# Patient Record
Sex: Female | Born: 1995 | Race: White | Hispanic: No | Marital: Single | Smoking: Never smoker
Health system: Southern US, Community
[De-identification: ages and names within clinical notes are randomized; demographics above are authoritative.]

## PROBLEM LIST (undated history)

## (undated) DIAGNOSIS — K8020 Calculus of gallbladder without cholecystitis without obstruction: Secondary | ICD-10-CM

## (undated) HISTORY — PX: EYE SURGERY: SHX253

---
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
## 2017-01-29 IMAGING — US US ABDOMEN COMPLETE
1 series · 14 of 25 positions shown · non-contrast
Comparison: None.

CLINICAL DATA: Sharp epigastric pain and nausea and since early
this morning.

EXAM:
ULTRASOUND ABDOMEN COMPLETE

[Series 1: us abdomen complete · 0.22mm/px · 14 of 86 slices shown]
[im 1/86]
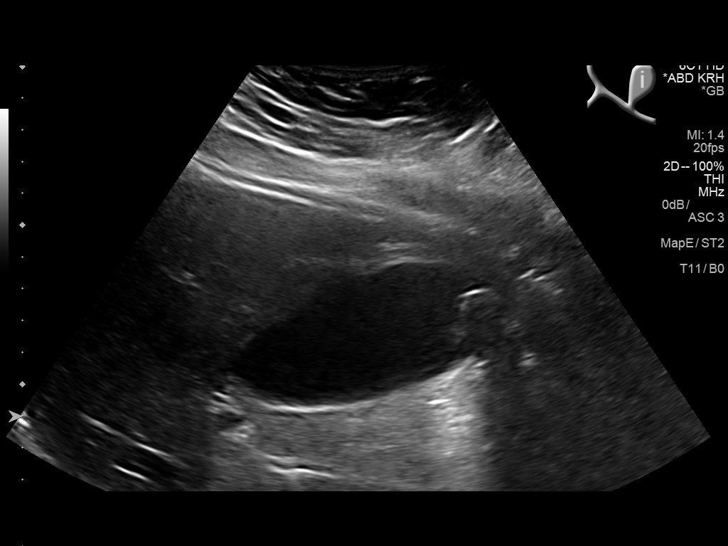
[im 8/86]
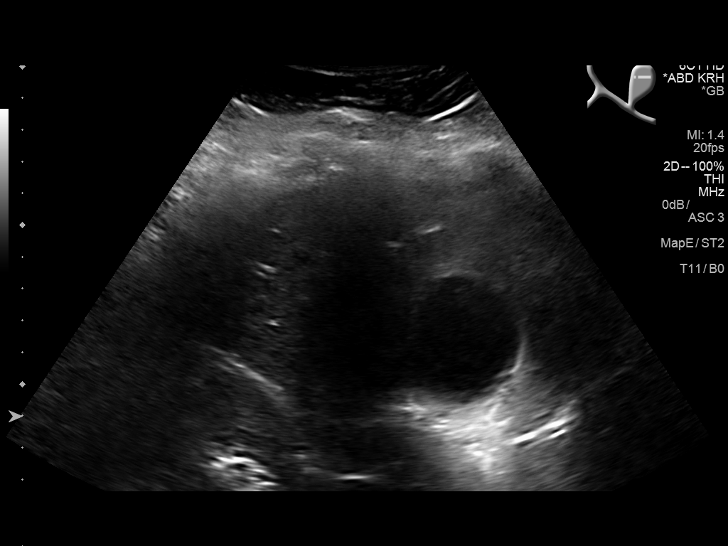
[im 15/86]
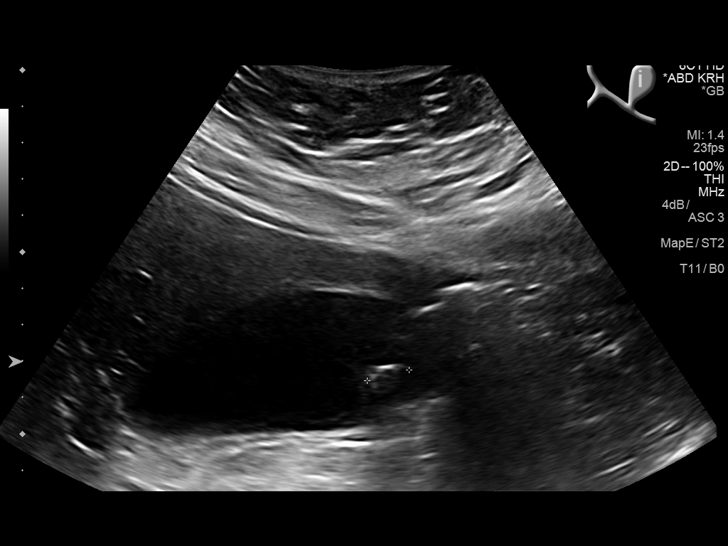
[im 22/86]
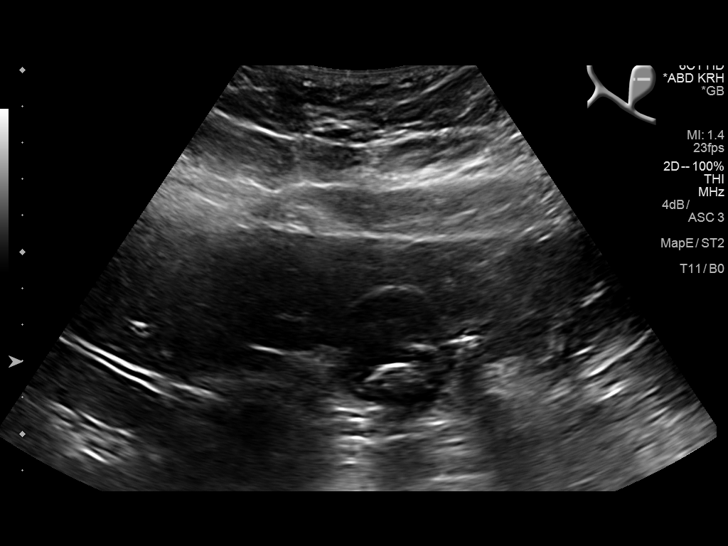
[im 29/86]
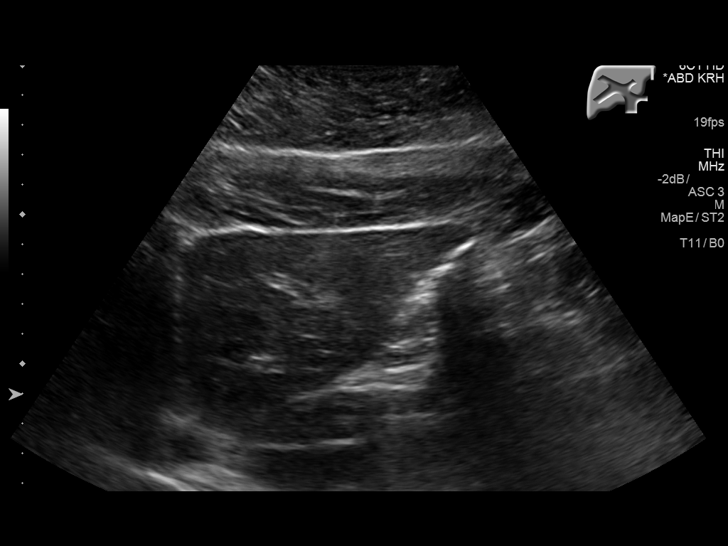
[im 32/86]
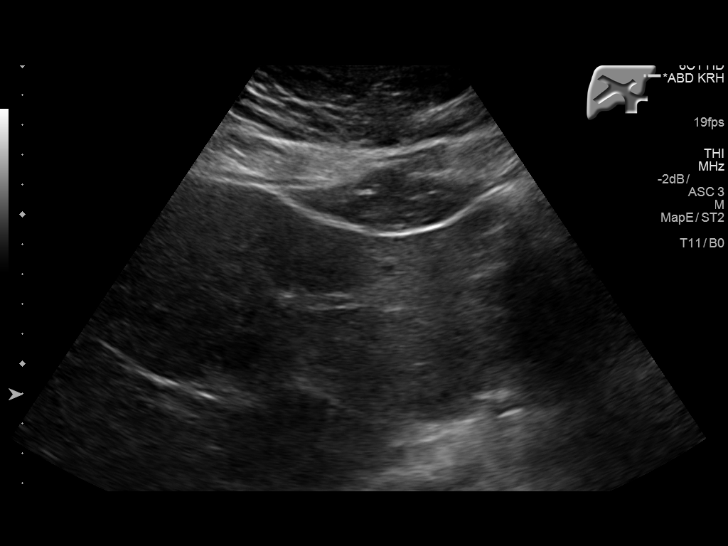
[im 39/86]
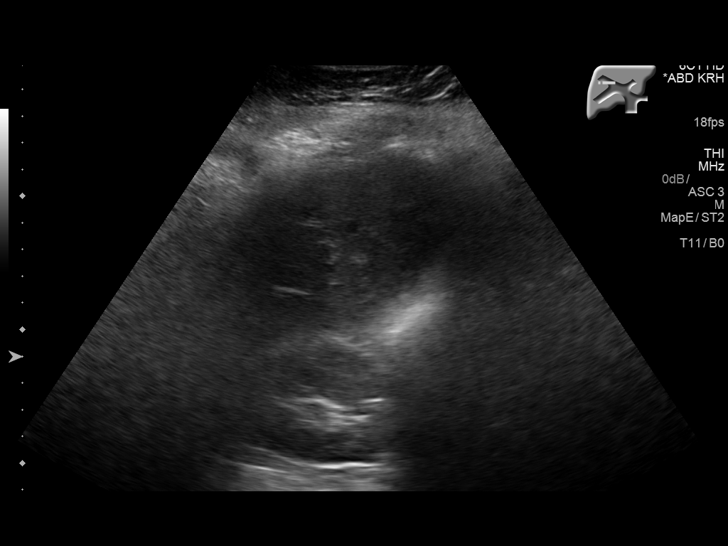
[im 47/86]
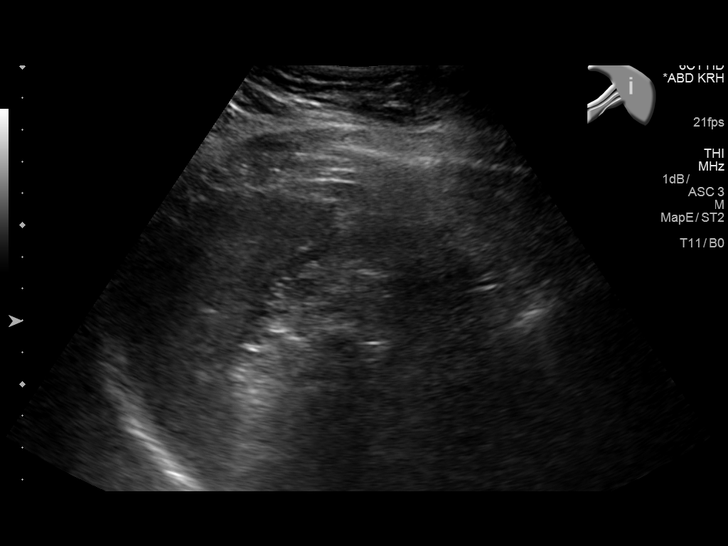
[im 54/86]
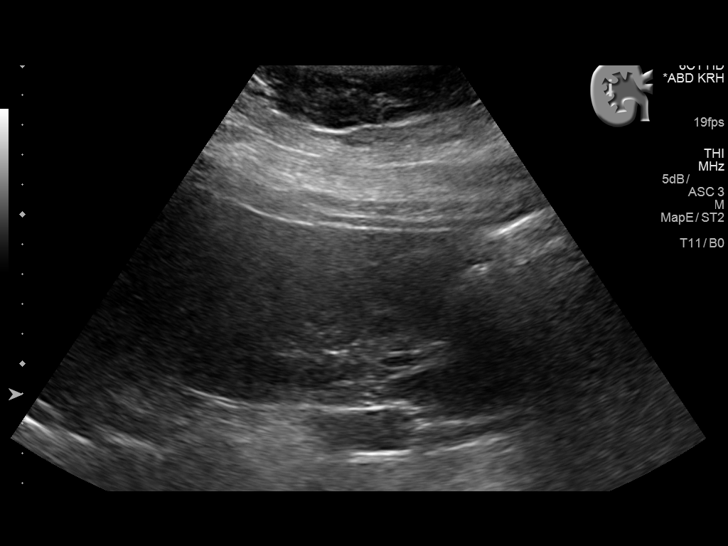
[im 57/86]
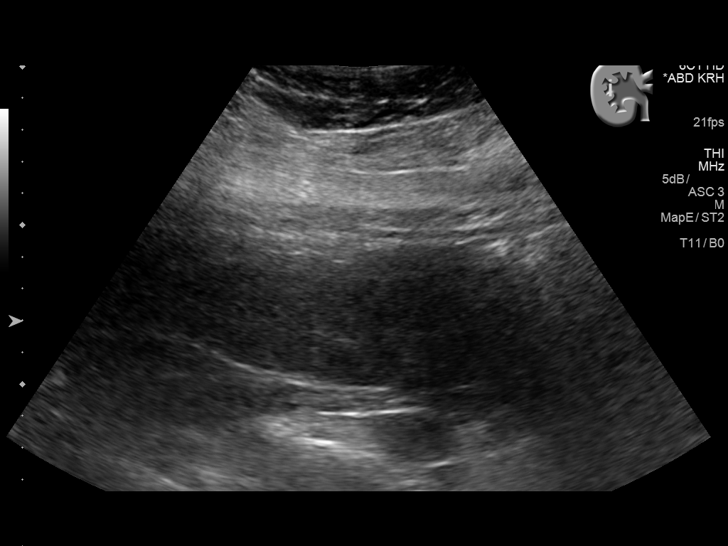
[im 64/86]
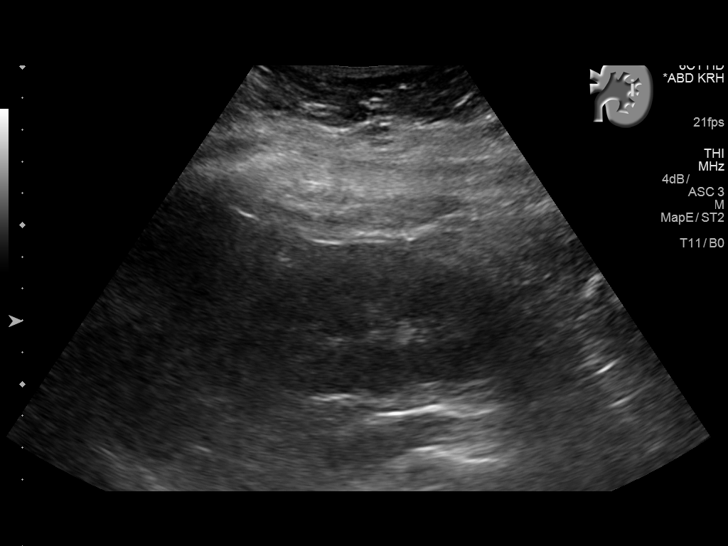
[im 71/86]
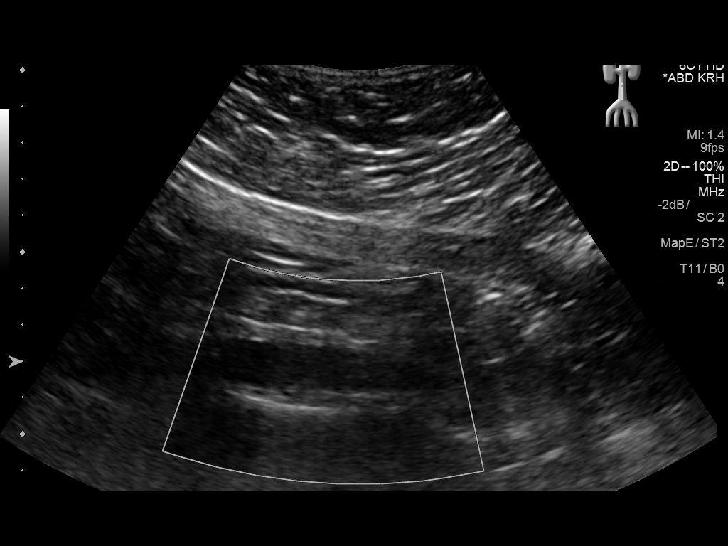
[im 78/86]
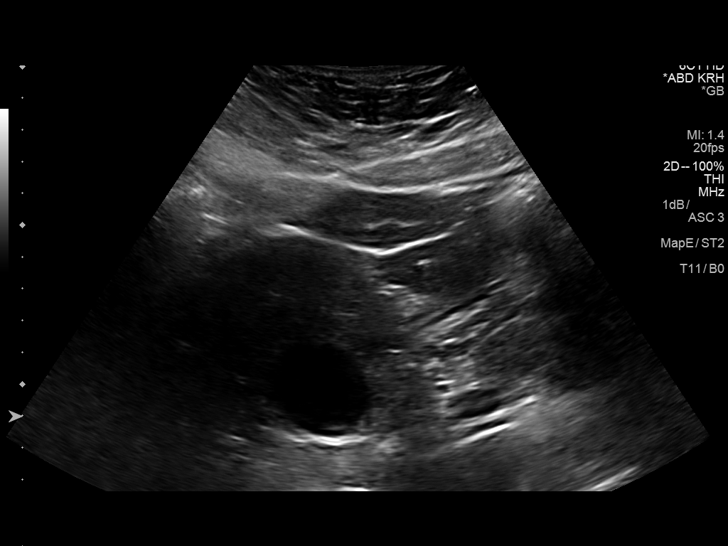
[im 86/86]
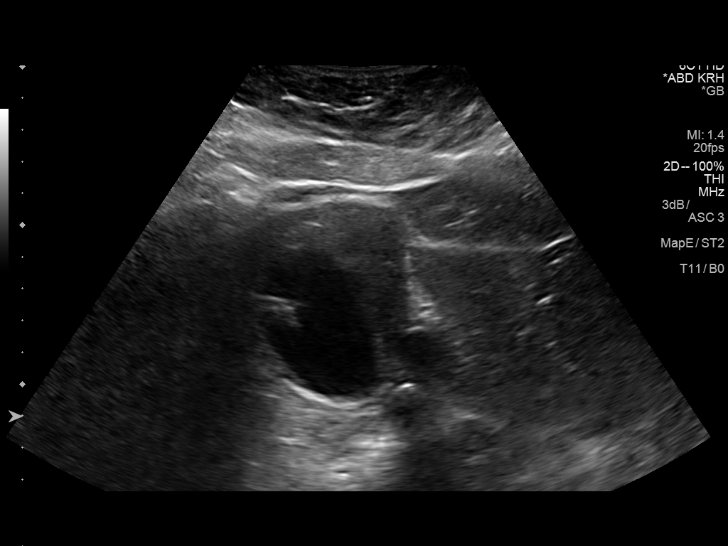

[14 of 25 positions shown; findings below may reference images not displayed]

FINDINGS: Gallbladder: Echogenic stones with posterior acoustic shadowing at
the gallbladder fundus. Mild distention of the gallbladder without
wall thickening. There is a stone at the gallbladder neck measuring
1.5 cm which appears immobile. Negative for sonographic Murphy's
sign.

Common bile duct: Diameter: 0.2 cm.

Liver: No focal lesion identified. Within normal limits in
parenchymal echogenicity.

IVC: No abnormality visualized.

Pancreas: Limited evaluation.

Spleen: Size and appearance within normal limits.

Right Kidney: Length: 12.8 cm. Echogenicity within normal limits. No
mass or hydronephrosis visualized.

Left Kidney: Length: 11.7 cm. Echogenicity within normal limits. No
mass or hydronephrosis visualized.

Abdominal aorta: No aneurysm visualized.

Other findings: None.
IMPRESSION: Cholelithiasis without gallbladder wall thickening and no biliary
dilatation. Largest gallstone measures 1.5 cm near the gallbladder
neck.

## 2017-07-02 IMAGING — RF DG CHOLANGIOGRAM OPERATIVE
1 series · 4 of 4 positions shown · non-contrast
Comparison: Prior right upper quadrant ultrasound 02/10/2016

CLINICAL DATA: 19-year-old female with cholelithiasis

EXAM:
INTRAOPERATIVE CHOLANGIOGRAM
TECHNIQUE: Cholangiographic images from the C-arm fluoroscopic device were
submitted for interpretation post-operatively. Please see the
procedural report for the amount of contrast and the fluoroscopy
time utilized.

[Series 1: run · 4 of 29 frames shown]
[frame 1/29]
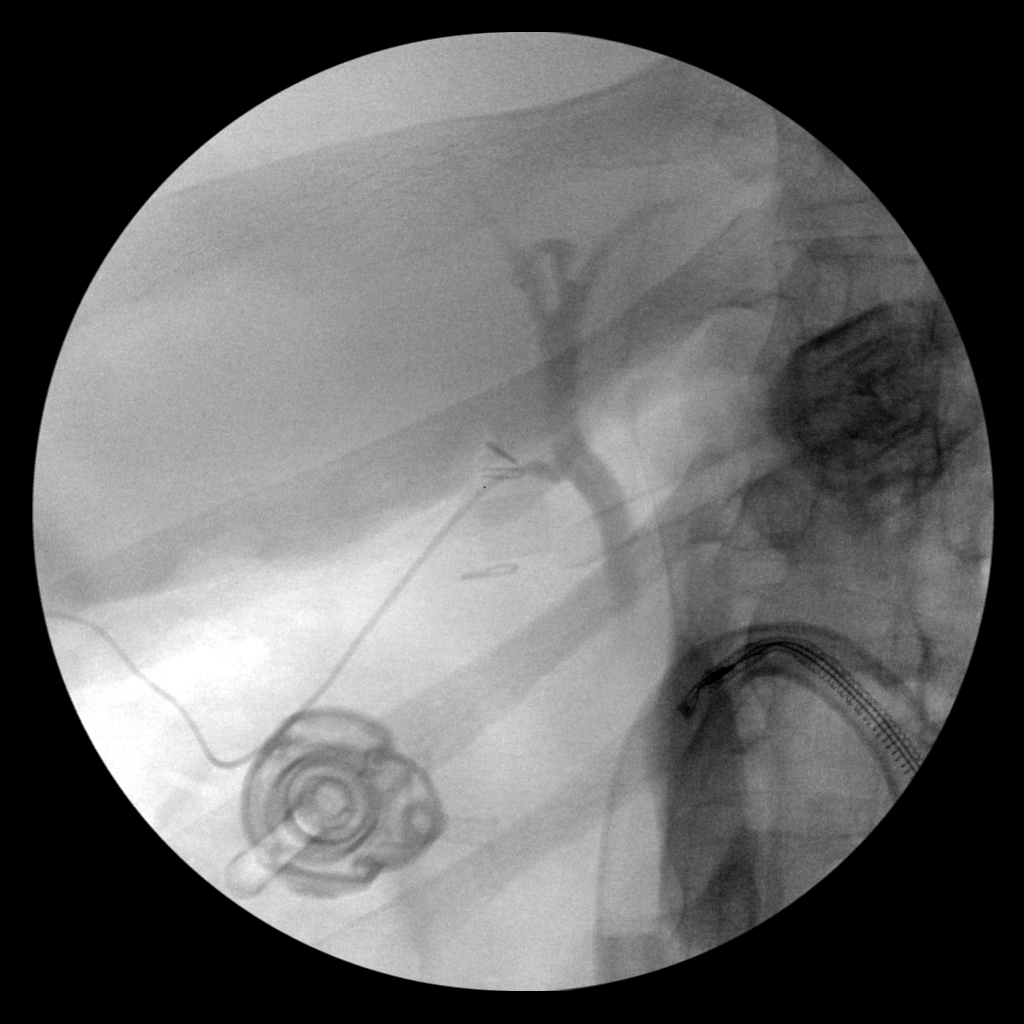
[frame 5/29]
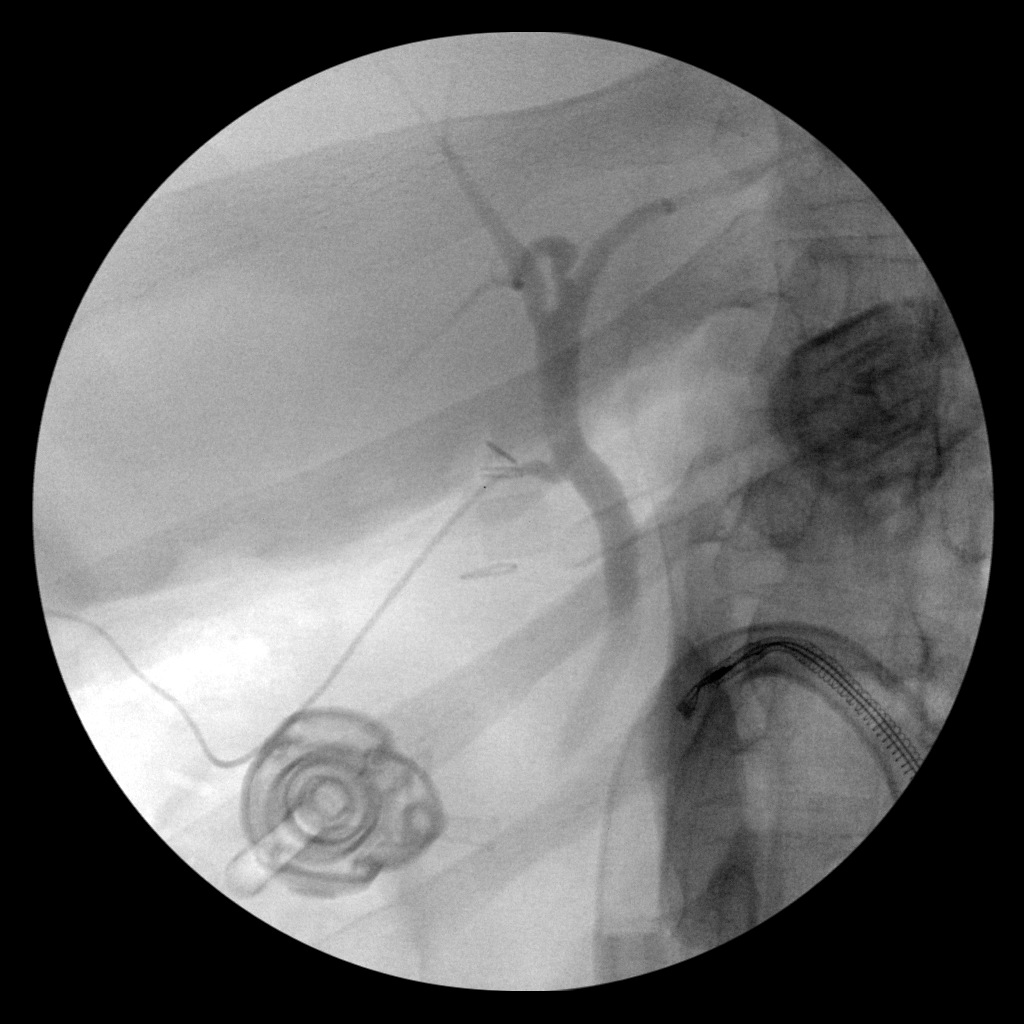
[frame 15/29]
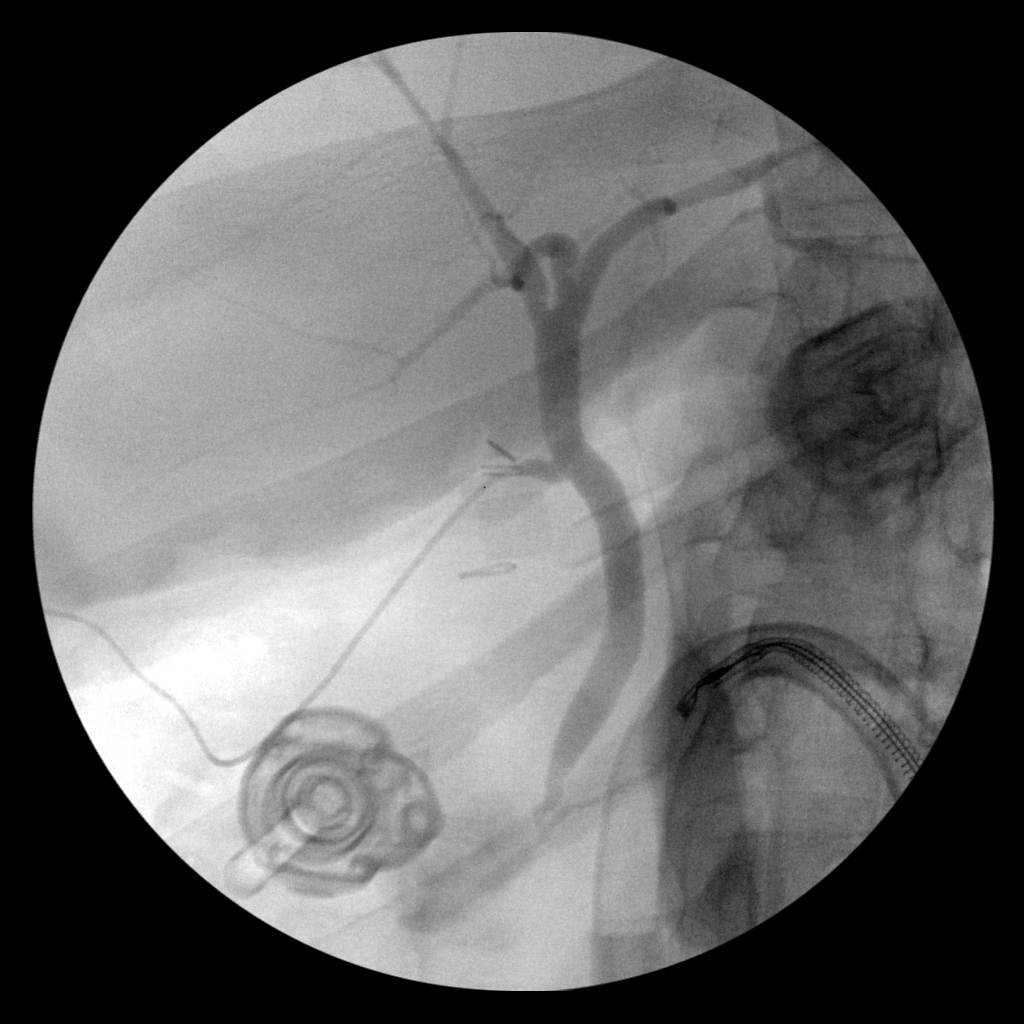
[frame 25/29]
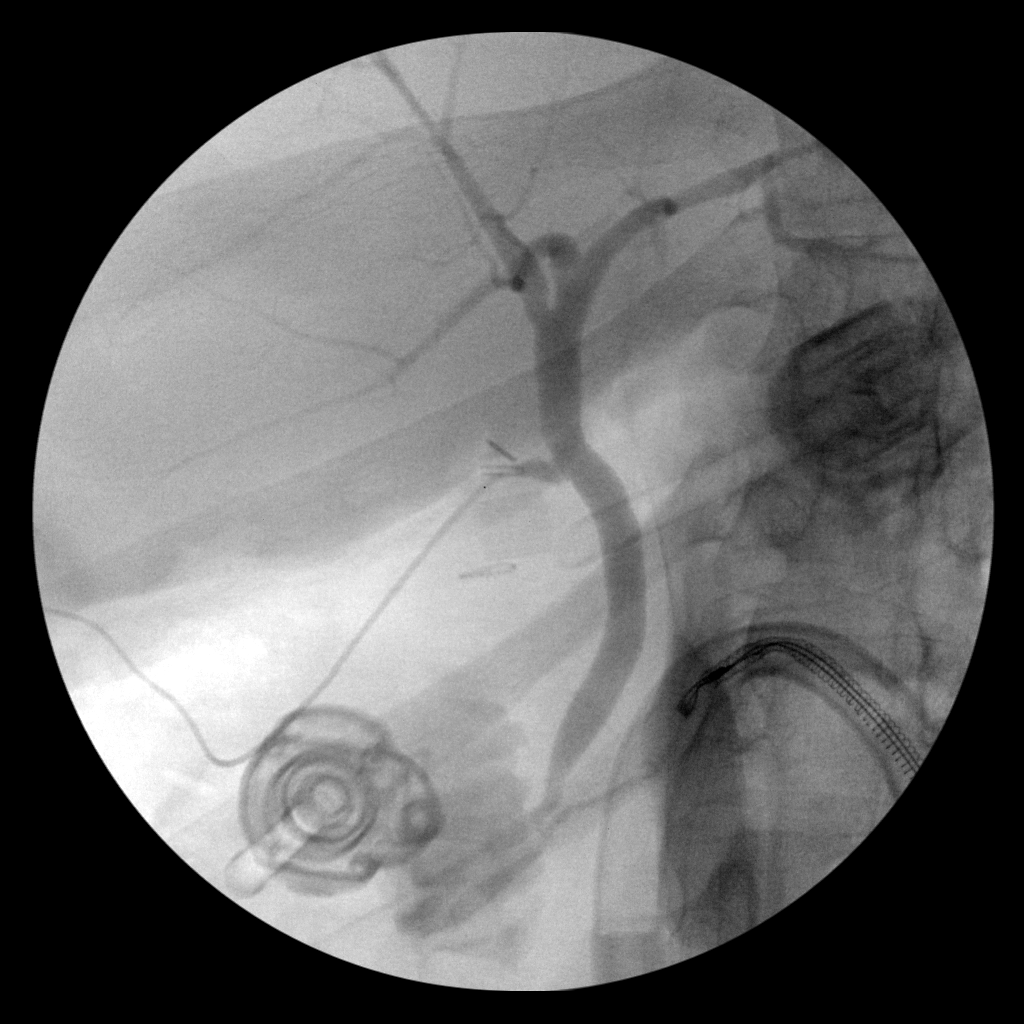

[4 of 4 positions shown; findings below may reference images not displayed]

FINDINGS: Cine clip obtained during intraoperative cholangiogram at the time
of laparoscopic cholecystectomy. Images demonstrate cannulation of
the cystic duct remanent and opacification of the biliary tree. No
evidence of biliary ductal dilatation, stenosis, stricture or
choledocholithiasis. Incidental note is made of variant anatomy. The
right posterior ductal system drains into the left main hepatic
duct. Contrast material passes freely through the ampulla into the
duodenum.
IMPRESSION: 1. Negative intraoperative cholangiogram.
2. Incidentally noted variant biliary anatomy.

## 2017-12-03 IMAGING — US US ABDOMEN LIMITED
1 series · 14 of 25 positions shown · non-contrast
Comparison: Ultrasound 04/08/2015

CLINICAL DATA: Epigastric pain for 2 days.

EXAM:
US ABDOMEN LIMITED - RIGHT UPPER QUADRANT

[Series 1: us abdomen limited · 0.21mm/px · 14 of 41 slices shown]
[im 1/41]
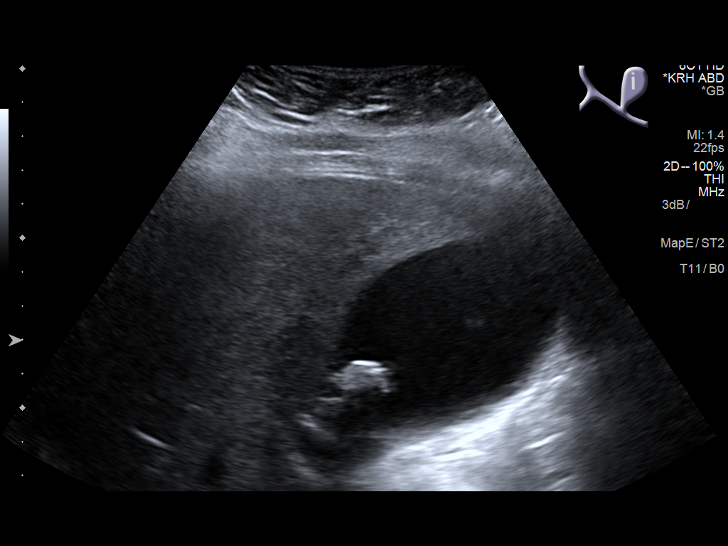
[im 4/41]
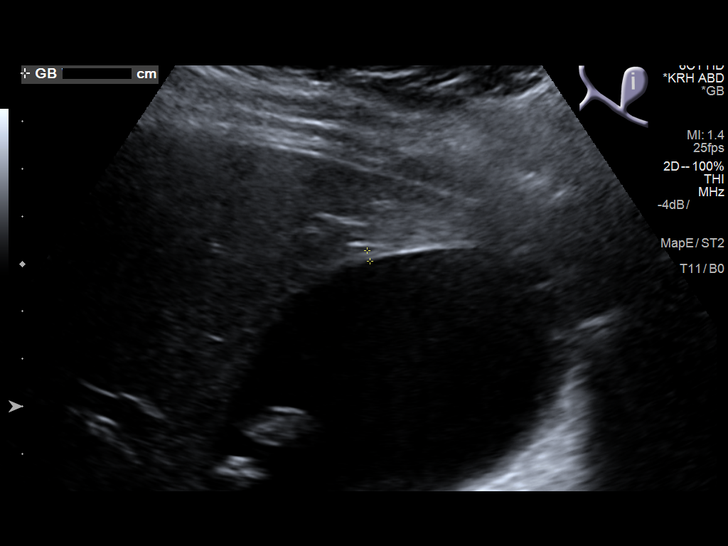
[im 7/41]
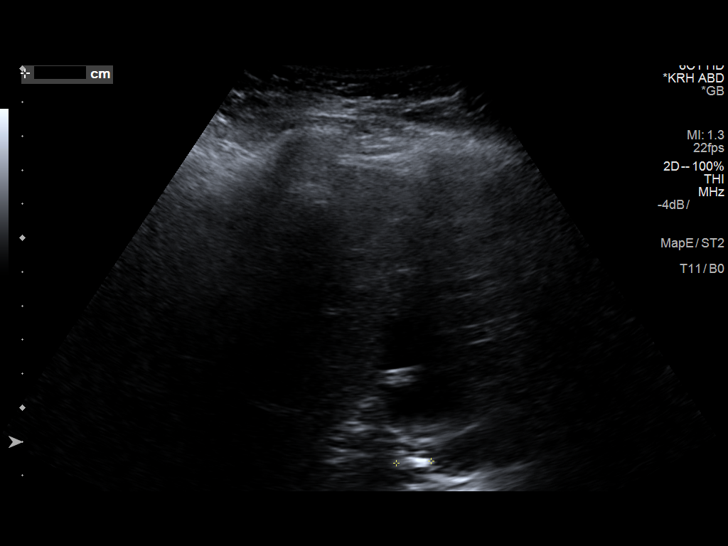
[im 11/41]
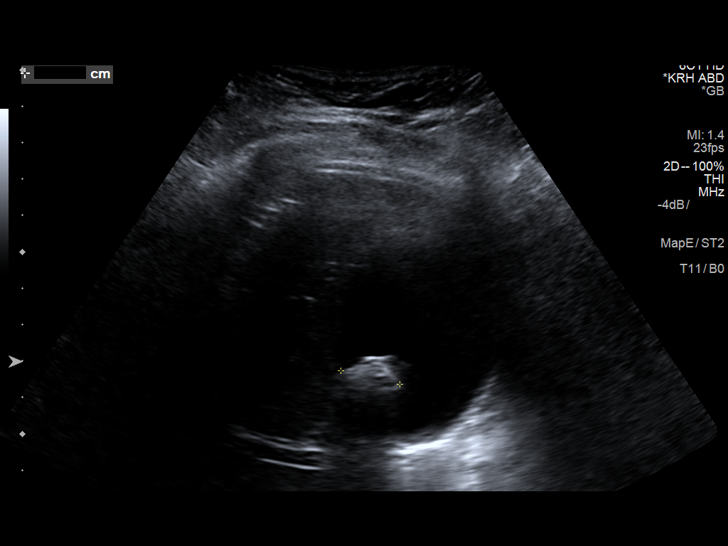
[im 14/41]
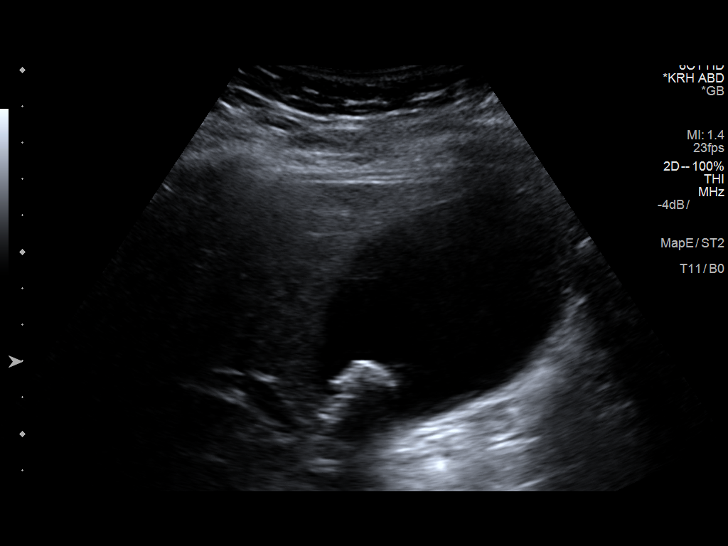
[im 16/41]
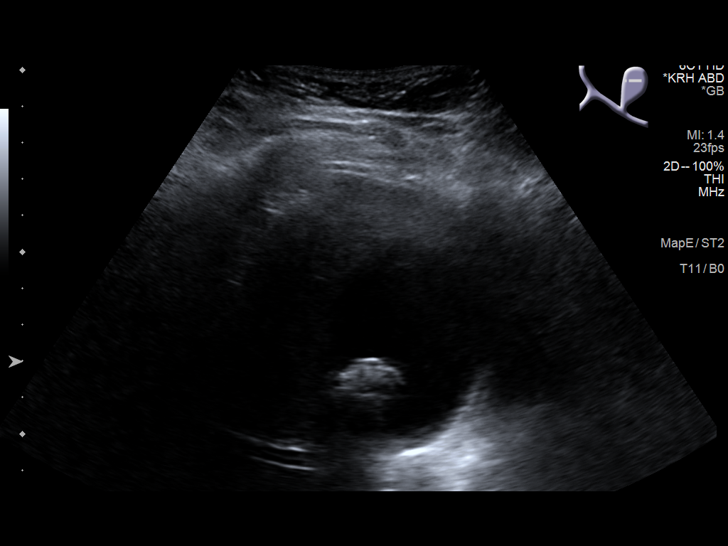
[im 19/41]
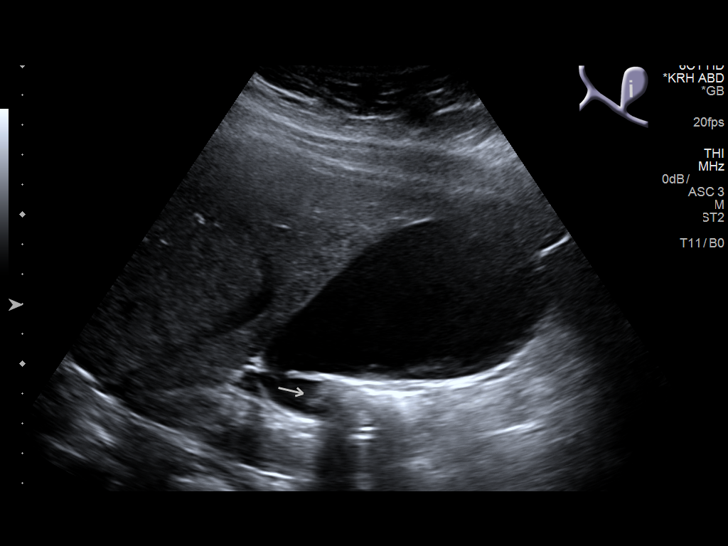
[im 22/41]
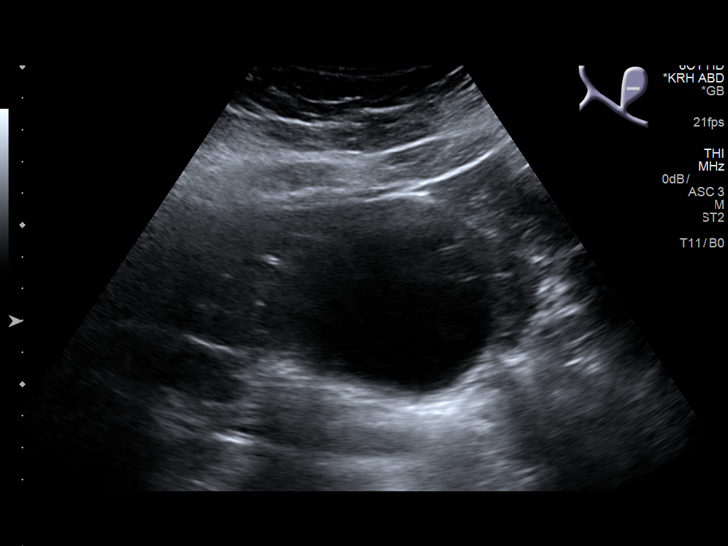
[im 26/41]
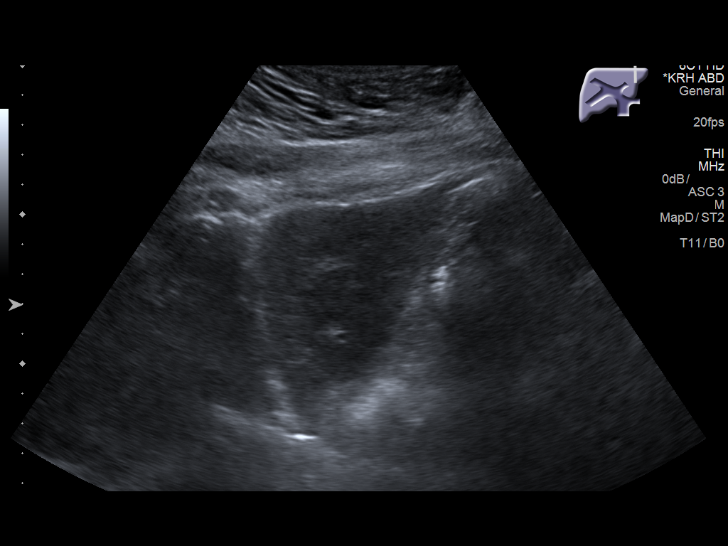
[im 27/41]
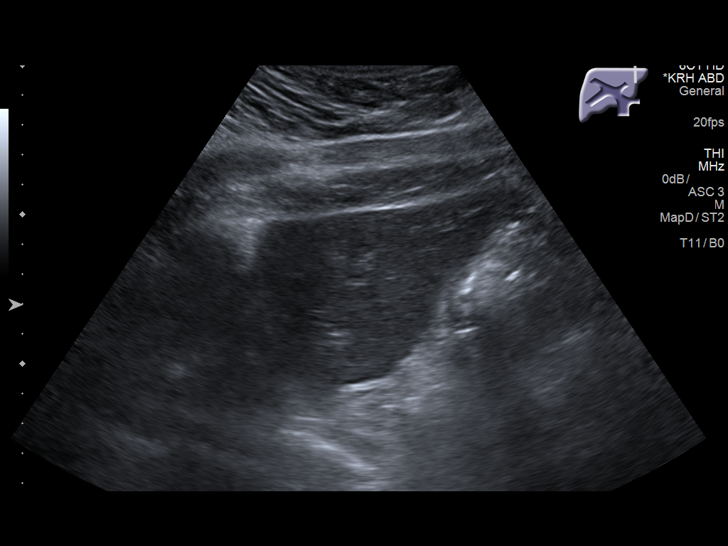
[im 31/41]
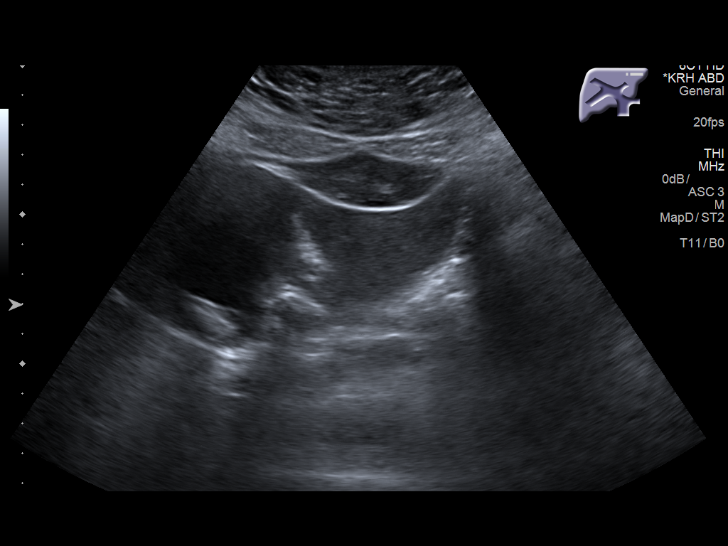
[im 34/41]
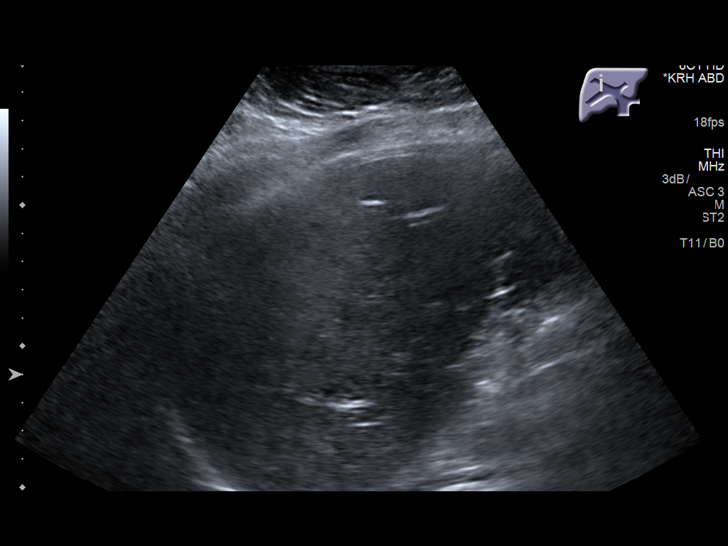
[im 37/41]
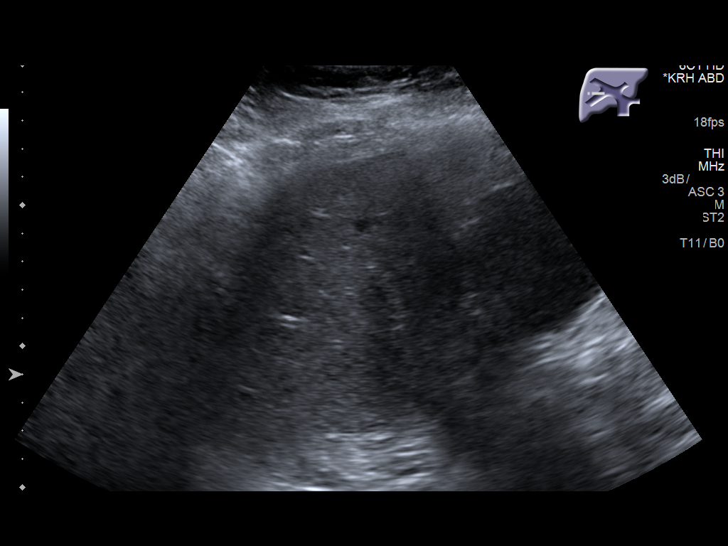
[im 41/41]
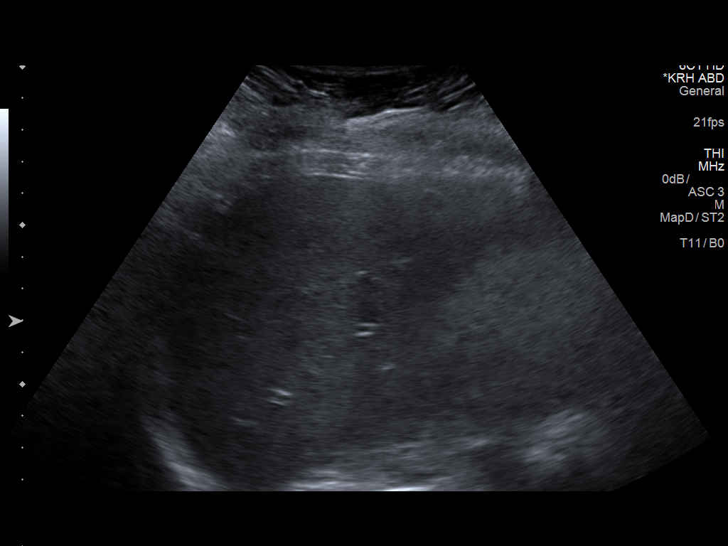

[14 of 25 positions shown; findings below may reference images not displayed]

FINDINGS: Gallbladder:

Two mobile gallstones noted, the largest 18 mm. There is a 1 cm non
mobile gallstone within the gallbladder neck. Gallbladder is
distended. Small amount of layering sludge.

Common bile duct:

Diameter: Normal caliber, 3 mm

Liver:

No focal lesion identified. Within normal limits in parenchymal
echogenicity.
IMPRESSION: Cholelithiasis. Two mobile gallstones as well as by 1 cm non mobile
gallstone within the gallbladder neck. Gallbladder is mildly
distended and a small amount of sludge present. No visible wall
thickening or sonographic Murphy sign.

## 2018-05-16 ENCOUNTER — Encounter (HOSPITAL_BASED_OUTPATIENT_CLINIC_OR_DEPARTMENT_OTHER): Payer: Self-pay | Admitting: *Deleted

## 2018-05-16 ENCOUNTER — Emergency Department (HOSPITAL_BASED_OUTPATIENT_CLINIC_OR_DEPARTMENT_OTHER)
Admission: EM | Admit: 2018-05-16 | Discharge: 2018-05-16 | Disposition: A | Discharge status: Home / Self Care | Payer: BLUE CROSS/BLUE SHIELD | Attending: Emergency Medicine | Admitting: Emergency Medicine

## 2018-05-16 ENCOUNTER — Other Ambulatory Visit: Payer: Self-pay

## 2018-05-16 ENCOUNTER — Emergency Department (HOSPITAL_BASED_OUTPATIENT_CLINIC_OR_DEPARTMENT_OTHER): Payer: BLUE CROSS/BLUE SHIELD

## 2018-05-16 DIAGNOSIS — S93401A Sprain of unspecified ligament of right ankle, initial encounter: Secondary | ICD-10-CM

## 2018-05-16 DIAGNOSIS — X500XXA Overexertion from strenuous movement or load, initial encounter: Secondary | ICD-10-CM | POA: Insufficient documentation

## 2018-05-16 DIAGNOSIS — Y9301 Activity, walking, marching and hiking: Secondary | ICD-10-CM | POA: Diagnosis not present

## 2018-05-16 DIAGNOSIS — Y999 Unspecified external cause status: Secondary | ICD-10-CM | POA: Insufficient documentation

## 2018-05-16 DIAGNOSIS — Y929 Unspecified place or not applicable: Secondary | ICD-10-CM | POA: Insufficient documentation

## 2018-05-16 DIAGNOSIS — S99911A Unspecified injury of right ankle, initial encounter: Secondary | ICD-10-CM | POA: Diagnosis present

## 2018-05-16 MED ORDER — IBUPROFEN 800 MG PO TABS
800.0000 mg | ORAL_TABLET | Freq: Once | ORAL | Status: AC
  Administered 2018-05-16: 800 mg via ORAL

## 2018-05-16 MED ORDER — IBUPROFEN 800 MG PO TABS
ORAL_TABLET | ORAL | Status: AC
  Filled 2018-05-16: qty 1

## 2018-05-16 NOTE — ED Triage Notes (Signed)
Pt c/o right ankle injury x 1 hr ago  

## 2018-05-16 NOTE — Discharge Instructions (Signed)
You may alternate Tylenol 1000 mg every 6 hours as needed for pain and Ibuprofen 800 mg every 8 hours as needed for pain.  Please take Ibuprofen with food. ° °

## 2018-05-16 NOTE — ED Provider Notes (Addendum)
TIME SEEN: 12:54 AM  CHIEF COMPLAINT: Right ankle injury  HPI: Patient is a 22 year old female with no significant past medical history who presents to the emergency department with a right ankle injury that occurred just prior to arrival.  States she twisted her ankle when walking her dog.  Did not fall to the ground or hit her head.  No numbness or weakness.  ROS: See HPI Constitutional: no fever  Eyes: no drainage  ENT: no runny nose   Cardiovascular:  no chest pain  Resp: no SOB  GI: no vomiting GU: no dysuria Integumentary: no rash  Allergy: no hives  Musculoskeletal: no leg swelling  Neurological: no slurred speech ROS otherwise negative  PAST MEDICAL HISTORY/PAST SURGICAL HISTORY:  Past Medical History:  Diagnosis Date  . Gall stones     MEDICATIONS:  Prior to Admission medications   Medication Sig Start Date End Date Taking? Authorizing Provider  acetaminophen (TYLENOL) 325 MG tablet Take 2 tablets (650 mg total) by mouth every 6 (six) hours as needed for mild pain (or temp > 100). 02/12/16   Sherrie George, PA-C  ibuprofen (ADVIL,MOTRIN) 200 MG tablet You can take 2-3 tablets every 6 hours as needed. 02/12/16   Sherrie George, PA-C    ALLERGIES:  No Known Allergies  SOCIAL HISTORY:  Social History   Tobacco Use  . Smoking status: Never Smoker  Substance Use Topics  . Alcohol use: No    FAMILY HISTORY: No family history on file.  EXAM: BP 120/80   Pulse 90   Temp 98.6 F (37 C) (Oral)   Resp 16   Ht 5\' 8"  (1.727 m)   Wt 127 kg   LMP 04/18/2018   SpO2 100%   BMI 42.57 kg/m  CONSTITUTIONAL: Alert and oriented and responds appropriately to questions. Well-appearing; well-nourished HEAD: Normocephalic EYES: Conjunctivae clear, pupils appear equal, EOMI ENT: normal nose; moist mucous membranes NECK: Supple, no meningismus, no nuchal rigidity, no LAD  CARD: RRR; S1 and S2 appreciated; no murmurs, no clicks, no rubs, no gallops RESP: Normal chest  excursion without splinting or tachypnea; breath sounds clear and equal bilaterally; no wheezes, no rhonchi, no rales, no hypoxia or respiratory distress, speaking full sentences ABD/GI: Normal bowel sounds; non-distended; soft, non-tender, no rebound, no guarding, no peritoneal signs, no hepatosplenomegaly BACK:  The back appears normal and is non-tender to palpation, there is no CVA tenderness EXT: Patient has soft tissue swelling without ecchymosis over the lateral right ankle.  No bony deformity noted.  2+ DP pulse.  No ligamentous laxity.  No tenderness over the right foot.  No tenderness over the right proximal fibular head.  Normal ROM in all joints; otherwise extremities are non-tender to palpation; no edema; normal capillary refill; no cyanosis, no calf tenderness or swelling    SKIN: Normal color for age and race; warm; no rash NEURO: Moves all extremities equally PSYCH: The patient's mood and manner are appropriate. Grooming and personal hygiene are appropriate.  MEDICAL DECISION MAKING: Patient here with right ankle injury.  X-ray shows no sniffing and fracture or dislocation.  Is a tiny suspect her lateral hindfoot avulsion fracture from her sprain.  Will wrap with Ace wrap.  Recommend rest, elevation and ice.  Recommended alternating Tylenol and Motrin for pain.  Offered crutches for comfort but she states she feels she can walk.  I feel she can weight-bear as tolerated.   At this time, I do not feel there is any life-threatening condition present. I  have reviewed and discussed all results (EKG, imaging, lab, urine as appropriate) and exam findings with patient/family. I have reviewed nursing notes and appropriate previous records.  I feel the patient is safe to be discharged home without further emergent workup and can continue workup as an outpatient as needed. Discussed usual and customary return precautions. Patient/family verbalize understanding and are comfortable with this plan.   Outpatient follow-up has been provided if needed. All questions have been answered.        Nuria Phebus, Layla Maw, DO 05/16/18 0140    Josie Mesa, Layla Maw, DO 05/16/18 1610

## 2023-05-08 ENCOUNTER — Encounter (HOSPITAL_BASED_OUTPATIENT_CLINIC_OR_DEPARTMENT_OTHER): Payer: Self-pay | Admitting: Emergency Medicine

## 2023-05-08 ENCOUNTER — Emergency Department (HOSPITAL_BASED_OUTPATIENT_CLINIC_OR_DEPARTMENT_OTHER)
Admission: EM | Admit: 2023-05-08 | Discharge: 2023-05-08 | Disposition: A | Discharge status: Home / Self Care | Payer: Managed Care, Other (non HMO) | Attending: Emergency Medicine | Admitting: Emergency Medicine

## 2023-05-08 ENCOUNTER — Emergency Department (HOSPITAL_BASED_OUTPATIENT_CLINIC_OR_DEPARTMENT_OTHER): Payer: Managed Care, Other (non HMO)

## 2023-05-08 ENCOUNTER — Other Ambulatory Visit: Payer: Self-pay

## 2023-05-08 DIAGNOSIS — R109 Unspecified abdominal pain: Secondary | ICD-10-CM

## 2023-05-08 DIAGNOSIS — N132 Hydronephrosis with renal and ureteral calculous obstruction: Secondary | ICD-10-CM | POA: Diagnosis not present

## 2023-05-08 DIAGNOSIS — N200 Calculus of kidney: Secondary | ICD-10-CM

## 2023-05-08 DIAGNOSIS — D72829 Elevated white blood cell count, unspecified: Secondary | ICD-10-CM | POA: Insufficient documentation

## 2023-05-08 DIAGNOSIS — R1031 Right lower quadrant pain: Secondary | ICD-10-CM | POA: Diagnosis present

## 2023-05-08 LAB — COMPREHENSIVE METABOLIC PANEL
ALT: 21 U/L (ref 0–44)
AST: 21 U/L (ref 15–41)
Albumin: 3.9 g/dL (ref 3.5–5.0)
Alkaline Phosphatase: 59 U/L (ref 38–126)
Anion gap: 9 (ref 5–15)
BUN: 9 mg/dL (ref 6–20)
CO2: 24 mmol/L (ref 22–32)
Calcium: 8.9 mg/dL (ref 8.9–10.3)
Chloride: 102 mmol/L (ref 98–111)
Creatinine, Ser: 0.91 mg/dL (ref 0.44–1.00)
GFR, Estimated: >60 mL/min (ref >60)
Glucose, Bld: 107 mg/dL — ABNORMAL HIGH (ref 70–99)
Potassium: 3.8 mmol/L (ref 3.5–5.1)
Sodium: 135 mmol/L (ref 135–145)
Total Bilirubin: 0.7 mg/dL (ref 0.3–1.2)
Total Protein: 7.5 g/dL (ref 6.5–8.1)

## 2023-05-08 LAB — RAPID URINE DRUG SCREEN, HOSP PERFORMED
Amphetamines: NOT DETECTED
Barbiturates: NOT DETECTED
Benzodiazepines: NOT DETECTED
Cocaine: NOT DETECTED
Opiates: NOT DETECTED
Tetrahydrocannabinol: POSITIVE — AB

## 2023-05-08 LAB — PREGNANCY, URINE: Preg Test, Ur: NEGATIVE

## 2023-05-08 LAB — URINALYSIS, ROUTINE W REFLEX MICROSCOPIC
Bilirubin Urine: NEGATIVE
Glucose, UA: NEGATIVE mg/dL
Ketones, ur: NEGATIVE mg/dL
Nitrite: NEGATIVE
Protein, ur: NEGATIVE mg/dL
Specific Gravity, Urine: 1.02 (ref 1.005–1.030)
pH: 7.5 (ref 5.0–8.0)

## 2023-05-08 LAB — CBC WITH DIFFERENTIAL/PLATELET
Abs Immature Granulocytes: 0.06 10*3/uL (ref 0.00–0.07)
Basophils Absolute: 0.1 10*3/uL (ref 0.0–0.1)
Basophils Relative: 1 %
Eosinophils Absolute: 0.1 10*3/uL (ref 0.0–0.5)
Eosinophils Relative: 1 %
HCT: 39.4 % (ref 36.0–46.0)
Hemoglobin: 12.4 g/dL (ref 12.0–15.0)
Immature Granulocytes: 1 %
Lymphocytes Relative: 11 %
Lymphs Abs: 1.3 10*3/uL (ref 0.7–4.0)
MCH: 26.9 pg (ref 26.0–34.0)
MCHC: 31.5 g/dL (ref 30.0–36.0)
MCV: 85.5 fL (ref 80.0–100.0)
Monocytes Absolute: 0.6 10*3/uL (ref 0.1–1.0)
Monocytes Relative: 5 %
Neutro Abs: 9.8 10*3/uL — ABNORMAL HIGH (ref 1.7–7.7)
Neutrophils Relative %: 81 %
Platelets: 258 10*3/uL (ref 150–400)
RBC: 4.61 MIL/uL (ref 3.87–5.11)
RDW: 14.3 % (ref 11.5–15.5)
WBC: 11.9 10*3/uL — ABNORMAL HIGH (ref 4.0–10.5)
nRBC: 0 % (ref 0.0–0.2)

## 2023-05-08 LAB — URINALYSIS, MICROSCOPIC (REFLEX): RBC / HPF: >50 RBC/hpf (ref 0–5)

## 2023-05-08 LAB — LIPASE, BLOOD: Lipase: 24 U/L (ref 11–51)

## 2023-05-08 MED ORDER — IOHEXOL 300 MG/ML  SOLN
100.0000 mL | Freq: Once | INTRAMUSCULAR | Status: AC | PRN
  Administered 2023-05-08: 100 mL via INTRAVENOUS

## 2023-05-08 MED ORDER — TRAMADOL HCL 50 MG PO TABS
50.0000 mg | ORAL_TABLET | Freq: Four times a day (QID) | ORAL | 0 refills | Status: AC | PRN
Start: 2023-05-08 — End: ?

## 2023-05-08 MED ORDER — ONDANSETRON HCL 4 MG PO TABS: 4.0000 mg | ORAL_TABLET | ORAL | 0 refills | Status: AC | PRN

## 2023-05-08 MED ORDER — SODIUM CHLORIDE 0.9 % IV BOLUS
1000.0000 mL | Freq: Once | INTRAVENOUS | Status: AC
  Administered 2023-05-08: 1000 mL via INTRAVENOUS

## 2023-05-08 MED ORDER — TAMSULOSIN HCL 0.4 MG PO CAPS: 0.4000 mg | ORAL_CAPSULE | Freq: Every day | ORAL | 0 refills | Status: AC

## 2023-05-08 NOTE — Discharge Instructions (Addendum)
It was a pleasure caring for you today in the emergency department.  If Your symptoms persist beyond 2 weeks please call urology for follow-up  Please return for any worsening worrisome symptoms including worsening abdominal pain, nausea and vomiting, fevers, chills, bleeding in your urine, etc.

## 2023-05-08 NOTE — ED Provider Notes (Signed)
[place] [facility] Provider Note  CSN: [id number] Arrival date & time: 05/08/23 1136  Chief Complaint(s) Abdominal Pain  HPI COLLYN OWYANG is a 27 y.o. female with past medical history as below, significant for cholecystectomy Dr. Maisie Fus 2017, obesity who presents to the ED with complaint of abdominal pain, nausea vomiting  Onset pain early this morning around 7 or 8 AM.  Sharp, stabbing, pressure sensation right lower quadrant, she vomited multiple times.  Upon arrival to the emergency department her symptoms have improved.  No longer nauseated.  Pain greatly improved.  No fevers or chills, no change in bowel or bladder function, no abdominal pelvic trauma.  No abnormal vaginal bleeding or discharge.  Denies similar complaints in the past.  No recent diet or medication changes.  Past Medical History Past Medical History:  Diagnosis Date  . Gall stones    Patient Active Problem List   Diagnosis Date Noted  . Cholelithiasis with cholecystitis without obstruction 02/12/2016  . Symptomatic cholelithiasis 02/10/2016  . Cholelithiasis 02/10/2016   Home Medication(s) Prior to Admission medications   Medication Sig Start Date End Date Taking? Authorizing Provider  ondansetron (ZOFRAN) 4 MG tablet Take 1 tablet (4 mg total) by mouth every 4 (four) hours as needed for nausea or vomiting. 05/08/23  Yes Tanda Rockers A, DO  tamsulosin (FLOMAX) 0.4 MG CAPS capsule Take 1 capsule (0.4 mg total) by mouth daily for 7 days. 05/08/23 05/15/23 Yes Sloan Leiter, DO  traMADol (ULTRAM) 50 MG tablet Take 1 tablet (50 mg total) by mouth every 6 (six) hours as needed. 05/08/23  Yes Sloan Leiter, DO  acetaminophen (TYLENOL) 325 MG tablet Take 2 tablets (650 mg total) by mouth every 6 (six) hours as needed for mild pain (or temp > 100). 02/12/16   Sherrie George, PA-C  ibuprofen (ADVIL,MOTRIN) 200 MG tablet You can take 2-3 tablets every 6 hours as needed. 02/12/16    Sherrie George, PA-C                                                                                                                                    Past Surgical History Past Surgical History:  Procedure Laterality Date  . CHOLECYSTECTOMY N/A 02/11/2016   Procedure: LAPAROSCOPIC CHOLECYSTECTOMY WITH INTRAOPERATIVE CHOLANGIOGRAM;  Surgeon: Romie Levee, MD;  Location: [facility];  Service: General;  Laterality: N/A;  . EYE SURGERY     Family History History reviewed. No pertinent family history.  Social History Social History   Tobacco Use  . Smoking status: Never  Substance Use Topics  . Alcohol use: No  . Drug use: No   Allergies Patient has no known allergies.  Review of Systems Review of Systems  Constitutional:  Negative for chills and fever.  Respiratory:  Negative for chest tightness and shortness of breath.   Cardiovascular:  Negative for chest pain and palpitations.  Gastrointestinal:  Positive for abdominal pain, nausea and vomiting. Negative for anal bleeding, blood in stool and diarrhea.  Genitourinary:  Negative for difficulty urinating and dysuria.  Musculoskeletal:  Negative for back pain.  All other systems reviewed and are negative.   Physical Exam Vital Signs  I have reviewed the triage vital signs BP (!) 114/58   Pulse 68   Temp 97.9 F (36.6 C) (Oral)   Resp 14   Ht 5\' 10"  (1.778 m)   Wt 129.3 kg   LMP 04/24/2023   SpO2 100%   BMI 40.89 kg/m  Physical Exam Vitals and nursing note reviewed.  Constitutional:      General: She is not in acute distress.    Appearance: Normal appearance.  HENT:     Head: Normocephalic and atraumatic.     Right Ear: External ear normal.     Left Ear: External ear normal.     Nose: Nose normal.     Mouth/Throat:     Mouth: Mucous membranes are moist.  Eyes:     General: No scleral icterus.       Right eye: No discharge.        Left eye: No discharge.  Cardiovascular:     Rate and Rhythm: Normal rate and  regular rhythm.     Pulses: Normal pulses.     Heart sounds: Normal heart sounds.  Pulmonary:     Effort: Pulmonary effort is normal. No respiratory distress.     Breath sounds: Normal breath sounds. No stridor.  Abdominal:     General: Abdomen is flat. There is no distension.     Palpations: Abdomen is soft.     Tenderness: There is abdominal tenderness. Negative signs include Murphy's sign and Rovsing's sign.     Comments: Slight right lower quadrant pain on deep palpation  Musculoskeletal:     Cervical back: No rigidity.     Right lower leg: No edema.     Left lower leg: No edema.  Skin:    General: Skin is warm and dry.     Capillary Refill: Capillary refill takes less than 2 seconds.  Neurological:     Mental Status: She is alert.  Psychiatric:        Mood and Affect: Mood normal.        Behavior: Behavior normal. Behavior is cooperative.     ED Results and Treatments Labs (all labs ordered are listed, but only abnormal results are displayed) Labs Reviewed  CBC WITH DIFFERENTIAL/PLATELET - Abnormal; Notable for the following components:      Result Value   WBC 11.9 (*)    Neutro Abs 9.8 (*)    All other components within normal limits  COMPREHENSIVE METABOLIC PANEL - Abnormal; Notable for the following components:   Glucose, Bld 107 (*)    All other components within normal limits  URINALYSIS, ROUTINE W REFLEX MICROSCOPIC - Abnormal; Notable for the following components:   Hgb urine dipstick LARGE (*)    All other components within normal limits  RAPID URINE DRUG SCREEN, HOSP PERFORMED - Abnormal; Notable for the following components:   Tetrahydrocannabinol POSITIVE (*)    All other components within normal limits  URINALYSIS, MICROSCOPIC (REFLEX) - Abnormal; Notable for the following components:   Bacteria, UA FEW (*)    All other components within normal limits  LIPASE, BLOOD  PREGNANCY, URINE  Radiology CT ABDOMEN PELVIS W CONTRAST  Result Date: 05/08/2023 CLINICAL DATA:  Right lower quadrant pain and vomiting beginning this morning. EXAM: CT ABDOMEN AND PELVIS WITH CONTRAST TECHNIQUE: Multidetector CT imaging of the abdomen and pelvis was performed using the standard protocol following bolus administration of intravenous contrast. RADIATION DOSE REDUCTION: This exam was performed according to the departmental dose-optimization program which includes automated exposure control, adjustment of the mA and/or kV according to patient size and/or use of iterative reconstruction technique. CONTRAST:  OMNIPAQUE IOHEXOL 300 MG/ML  SOLN COMPARISON:  None Available. FINDINGS: Lower Chest: No acute findings. Hepatobiliary: No suspicious hepatic masses identified. Prior cholecystectomy. No evidence of biliary obstruction. Pancreas:  No mass or inflammatory changes. Spleen: Within normal limits in size and appearance. Adrenals/Urinary Tract: No suspicious masses identified. Mild right hydroureteronephrosis is seen due to a 1-2 mm calculus at the right UVJ. Stomach/Bowel: No evidence of obstruction, inflammatory process or abnormal fluid collections. Vascular/Lymphatic: No pathologically enlarged lymph nodes. No acute vascular findings. Reproductive:  No mass or other significant abnormality. Other:  None. Musculoskeletal:  No suspicious bone lesions identified. IMPRESSION: Mild right hydroureteronephrosis due to punctate 1-2 mm calculus at the right UVJ. Electronically Signed   By: Danae Orleans M.D.   On: 05/08/2023 13:30    Pertinent labs & imaging results that were available during my care of the patient were reviewed by me and considered in my medical decision making (see MDM for details).  Medications Ordered in ED Medications  sodium chloride 0.9 % bolus 1,000 mL (1,000 mLs Intravenous New Bag/Given 05/08/23 1251)  iohexol (OMNIPAQUE) 300 MG/ML  solution 100 mL (100 mLs Intravenous Contrast Given 05/08/23 1300)                                                                                                                                     Procedures Procedures  (including critical care time)  Medical Decision Making / ED Course    Medical Decision Making:    JUNG BURBACK is a 27 y.o. female  with past medical history as below, significant for cholecystectomy Dr. Maisie Fus 2017, obesity who presents to the ED with complaint of abdominal pain, nausea vomiting. The complaint involves an extensive differential diagnosis and also carries with it a high risk of complications and morbidity.  Serious etiology was considered. Ddx includes but is not limited to: Differential diagnosis includes but is not exclusive to ectopic pregnancy, ovarian cyst, ovarian torsion, acute appendicitis, urinary tract infection, endometriosis, bowel obstruction, hernia, colitis, renal colic, gastroenteritis, volvulus etc.   Complete initial physical exam performed, notably the patient  was no acute distress, sitting upright, abdomen benign.    Reviewed and confirmed nursing documentation for past medical history, family history, social history.  Vital signs reviewed.    Clinical Course as of 05/08/23 1439  Sun May 08, 2023  1254 WBC(!): 11.9 Slight leukocytosis [SG]  1312 Tetrahydrocannabinol(!):  POSITIVE [SG]  1312 Bacteria, UA(!): FEW [SG]  1312 Squamous Epithelial / HPF: 6-10 Dirty catch [SG]  1351 CT with 1 to 2 mm calculus at R UVJ with mild hydroureteronephrosis.  Function stable.  No signs of infection on urinalysis.  Reasonable to trial outpatient management.  This calculus will likely pass spontaneously.  Start Flomax, follow-up with urology if symptoms persist [SG]  1426 Symptoms have resolved and not returned [SG]    Clinical Course User Index [SG] Sloan Leiter, DO     Given location of pain will get CT imaging and screening  labs  She is feeling much better.  Imaging concerning for obstructive nephrolithiasis.  Kidney function stable.  No evidence of sepsis.  Stone should likely pass on its own without intervention.  Will rx Flomax, antiemetic, analgesic for home.  Follow-up with urology if symptoms persist.  The patient improved significantly and was discharged in stable condition. Detailed discussions were had with the patient regarding current findings, and need for close f/u with PCP or on call doctor. The patient has been instructed to return immediately if the symptoms worsen in any way for re-evaluation. Patient verbalized understanding and is in agreement with current care plan. All questions answered prior to discharge.                 Additional history obtained: -Additional history obtained from na -External records from outside source obtained and reviewed including: Chart review including previous notes, labs, imaging, consultation notes including  Prior admission, prior surgical procedures, prior ED notes, home medications   Lab Tests: -I ordered, reviewed, and interpreted labs.   The pertinent results include:   Labs Reviewed  CBC WITH DIFFERENTIAL/PLATELET - Abnormal; Notable for the following components:      Result Value   WBC 11.9 (*)    Neutro Abs 9.8 (*)    All other components within normal limits  COMPREHENSIVE METABOLIC PANEL - Abnormal; Notable for the following components:   Glucose, Bld 107 (*)    All other components within normal limits  URINALYSIS, ROUTINE W REFLEX MICROSCOPIC - Abnormal; Notable for the following components:   Hgb urine dipstick LARGE (*)    All other components within normal limits  RAPID URINE DRUG SCREEN, HOSP PERFORMED - Abnormal; Notable for the following components:   Tetrahydrocannabinol POSITIVE (*)    All other components within normal limits  URINALYSIS, MICROSCOPIC (REFLEX) - Abnormal; Notable for the following components:    Bacteria, UA FEW (*)    All other components within normal limits  LIPASE, BLOOD  PREGNANCY, URINE    Notable for slight leukocytosis  EKG   EKG Interpretation Date/Time:    Ventricular Rate:    PR Interval:    QRS Duration:    QT Interval:    QTC Calculation:   R Axis:      Text Interpretation:           Imaging Studies ordered: I ordered imaging studies including CT abdomen pelvis I independently visualized the following imaging with scope of interpretation limited to determining acute life threatening conditions related to emergency care; findings noted above, significant for as above, nephrolithiasis  I independently visualized and interpreted imaging. I agree with the radiologist interpretation   Medicines ordered and prescription drug management: Meds ordered this encounter  Medications  . sodium chloride 0.9 % bolus 1,000 mL  . iohexol (OMNIPAQUE) 300 MG/ML solution 100 mL  . tamsulosin (FLOMAX) 0.4 MG CAPS capsule  Sig: Take 1 capsule (0.4 mg total) by mouth daily for 7 days.    Dispense:  7 capsule    Refill:  0  . ondansetron (ZOFRAN) 4 MG tablet    Sig: Take 1 tablet (4 mg total) by mouth every 4 (four) hours as needed for nausea or vomiting.    Dispense:  6 tablet    Refill:  0  . traMADol (ULTRAM) 50 MG tablet    Sig: Take 1 tablet (50 mg total) by mouth every 6 (six) hours as needed.    Dispense:  10 tablet    Refill:  0    -I have reviewed the patients home medicines and have made adjustments as needed   Consultations Obtained: na   Cardiac Monitoring: Continuous pulse oximetry interpreted by myself, 100% on RA.    Social Determinants of Health:  Diagnosis or treatment significantly limited by social determinants of health: obesity no insurance, no pcp   Reevaluation: After the interventions noted above, I reevaluated the patient and found that they have resolved  Co morbidities that complicate the patient evaluation . Past Medical  History:  Diagnosis Date  . Gall stones       Dispostion: Disposition decision including need for hospitalization was considered, and patient discharged from emergency department.    Final Clinical Impression(s) / ED Diagnoses Final diagnoses:  Nephrolithiasis  Abdominal pain, unspecified abdominal location        Sloan Leiter, DO 05/08/23 1431

## 2023-05-08 NOTE — ED Triage Notes (Signed)
Pt c/o RLQ pain that started this morning; vomited x 1, denies diarrhea

## 2023-07-30 ENCOUNTER — Encounter (HOSPITAL_BASED_OUTPATIENT_CLINIC_OR_DEPARTMENT_OTHER): Payer: Self-pay | Admitting: Emergency Medicine

## 2023-07-30 ENCOUNTER — Other Ambulatory Visit: Payer: Self-pay

## 2023-07-30 ENCOUNTER — Emergency Department (HOSPITAL_BASED_OUTPATIENT_CLINIC_OR_DEPARTMENT_OTHER)
Admission: EM | Admit: 2023-07-30 | Discharge: 2023-07-31 | Disposition: A | Discharge status: Home / Self Care | Payer: Self-pay | Attending: Emergency Medicine | Admitting: Emergency Medicine

## 2023-07-30 DIAGNOSIS — R112 Nausea with vomiting, unspecified: Secondary | ICD-10-CM | POA: Diagnosis present

## 2023-07-30 LAB — CBC WITH DIFFERENTIAL/PLATELET
Abs Immature Granulocytes: 0.02 10*3/uL (ref 0.00–0.07)
Basophils Absolute: 0.1 10*3/uL (ref 0.0–0.1)
Basophils Relative: 1 %
Eosinophils Absolute: 0 10*3/uL (ref 0.0–0.5)
Eosinophils Relative: 0 %
HCT: 36.7 % (ref 36.0–46.0)
Hemoglobin: 11.9 g/dL — ABNORMAL LOW (ref 12.0–15.0)
Immature Granulocytes: 0 %
Lymphocytes Relative: 19 %
Lymphs Abs: 1.8 10*3/uL (ref 0.7–4.0)
MCH: 26.9 pg (ref 26.0–34.0)
MCHC: 32.4 g/dL (ref 30.0–36.0)
MCV: 82.8 fL (ref 80.0–100.0)
Monocytes Absolute: 0.5 10*3/uL (ref 0.1–1.0)
Monocytes Relative: 5 %
Neutro Abs: 7.3 10*3/uL (ref 1.7–7.7)
Neutrophils Relative %: 75 %
Platelets: 232 10*3/uL (ref 150–400)
RBC: 4.43 MIL/uL (ref 3.87–5.11)
RDW: 14.2 % (ref 11.5–15.5)
WBC: 9.8 10*3/uL (ref 4.0–10.5)
nRBC: 0 % (ref 0.0–0.2)

## 2023-07-30 LAB — BASIC METABOLIC PANEL
Anion gap: 10 (ref 5–15)
BUN: 7 mg/dL (ref 6–20)
CO2: 20 mmol/L — ABNORMAL LOW (ref 22–32)
Calcium: 9.1 mg/dL (ref 8.9–10.3)
Chloride: 107 mmol/L (ref 98–111)
Creatinine, Ser: 0.91 mg/dL (ref 0.44–1.00)
GFR, Estimated: >60 mL/min (ref >60)
Glucose, Bld: 108 mg/dL — ABNORMAL HIGH (ref 70–99)
Potassium: 3.6 mmol/L (ref 3.5–5.1)
Sodium: 137 mmol/L (ref 135–145)

## 2023-07-30 MED ORDER — SODIUM CHLORIDE 0.9 % IV BOLUS
1000.0000 mL | Freq: Once | INTRAVENOUS | Status: AC
  Administered 2023-07-30: 1000 mL via INTRAVENOUS

## 2023-07-30 MED ORDER — ONDANSETRON HCL 4 MG/2ML IJ SOLN
4.0000 mg | Freq: Once | INTRAMUSCULAR | Status: AC
  Administered 2023-07-30: 4 mg via INTRAVENOUS
  Filled 2023-07-30: qty 2

## 2023-07-30 MED ORDER — ONDANSETRON 4 MG PO TBDP
4.0000 mg | ORAL_TABLET | Freq: Three times a day (TID) | ORAL | 0 refills | Status: AC | PRN
Start: 2023-07-30 — End: ?

## 2023-07-30 NOTE — ED Triage Notes (Signed)
Pt reports NV since 1900 tonight; this has been an intermittent issue x 1 mo; denies pain

## 2023-07-30 NOTE — Discharge Instructions (Signed)
Your workup and exam was reassuring.  Return for any concerning symptoms.  I sent nausea medication to your pharmacy.  Follow-up with your primary care provider.  Information attached if you do not have 1 for clinic to establish care with.

## 2023-07-30 NOTE — ED Provider Notes (Signed)
[place] [facility] Provider Note   CSN: [id number] Arrival date & time: 07/30/23  2216     History  Chief Complaint  Patient presents with   Emesis    Heather Flores is a 27 y.o. female.  27 year old female presents today for concern of emesis.  She states started earlier today and was not able to keep down any liquids including water.  She states this occasionally has been happening over the past month.  No abdominal pain associated with this.  No urinary symptoms.  No hematemesis, or blood in stool.  No shortness of breath.  No other URI symptoms.  The history is provided by the patient. No language interpreter was used.       Home Medications Prior to Admission medications   Medication Sig Start Date End Date Taking? Authorizing Provider  acetaminophen (TYLENOL) 325 MG tablet Take 2 tablets (650 mg total) by mouth every 6 (six) hours as needed for mild pain (or temp > 100). 02/12/16   Sherrie George, PA-C  ibuprofen (ADVIL,MOTRIN) 200 MG tablet You can take 2-3 tablets every 6 hours as needed. 02/12/16   Sherrie George, PA-C  ondansetron (ZOFRAN) 4 MG tablet Take 1 tablet (4 mg total) by mouth every 4 (four) hours as needed for nausea or vomiting. 05/08/23   Sloan Leiter, DO  traMADol (ULTRAM) 50 MG tablet Take 1 tablet (50 mg total) by mouth every 6 (six) hours as needed. 05/08/23   Sloan Leiter, DO      Allergies    Patient has no known allergies.    Review of Systems   Review of Systems  Constitutional:  Negative for chills and fever.  Respiratory:  Negative for shortness of breath.   Gastrointestinal:  Positive for nausea and vomiting. Negative for abdominal pain and blood in stool.  Genitourinary:  Negative for difficulty urinating, dysuria, flank pain and frequency.  Neurological:  Negative for light-headedness.  All other systems reviewed and are negative.   Physical Exam Updated Vital Signs BP 131/78   Pulse  65   Temp 97.8 F (36.6 C) (Oral)   Resp 16   Ht 5\' 10"  (1.778 m)   Wt 120.2 kg   LMP 07/26/2023   SpO2 99%   BMI 38.02 kg/m  Physical Exam Vitals and nursing note reviewed.  Constitutional:      General: She is not in acute distress.    Appearance: Normal appearance. She is not ill-appearing.  HENT:     Head: Normocephalic and atraumatic.     Nose: Nose normal.  Eyes:     General: No scleral icterus.    Extraocular Movements: Extraocular movements intact.     Conjunctiva/sclera: Conjunctivae normal.  Cardiovascular:     Rate and Rhythm: Normal rate and regular rhythm.     Heart sounds: Normal heart sounds.  Pulmonary:     Effort: Pulmonary effort is normal. No respiratory distress.     Breath sounds: Normal breath sounds. No wheezing or rales.  Abdominal:     General: There is no distension.     Tenderness: There is no abdominal tenderness. There is no right CVA tenderness, left CVA tenderness or guarding.  Musculoskeletal:        General: Normal range of motion.     Cervical back: Normal range of motion.  Skin:    General: Skin is warm and dry.  Neurological:     General: No focal deficit present.  Mental Status: She is alert and oriented to person, place, and time. Mental status is at baseline.     ED Results / Procedures / Treatments   Labs (all labs ordered are listed, but only abnormal results are displayed) Labs Reviewed  CBC WITH DIFFERENTIAL/PLATELET  BASIC METABOLIC PANEL    EKG None  Radiology No results found.  Procedures Procedures    Medications Ordered in ED Medications  ondansetron (ZOFRAN) injection 4 mg (has no administration in time range)  sodium chloride 0.9 % bolus 1,000 mL (has no administration in time range)    ED Course/ Medical Decision Making/ A&P                                 Medical Decision Making Amount and/or Complexity of Data Reviewed Labs: ordered.  Risk Prescription drug management.   27 year old  female presents today for concern of nausea and vomiting starting 7 PM.  No abdominal pain.  No associated diarrhea, hematemesis, or blood in stool.  No symptoms for UTI.  Abdominal exam benign. Will obtain basic blood work, provide fluid bolus, and provide Zofran.  Will provide p.o. challenge prior to discharge.  CBC without leukocytosis.  Mild anemia no evidence of acute GI bleed.  Hemodynamically stable.  BMP with preserved renal function, normal electrolytes.  Glucose 108.  Patient passed p.o. trial.  She will establish PCP.  Strict return precaution discussed.  Patient is stable for discharge.  Patient and mom voiced understanding and are in agreement with plan.   Final Clinical Impression(s) / ED Diagnoses Final diagnoses:  Nausea and vomiting, unspecified vomiting type    Rx / DC Orders ED Discharge Orders          Ordered    ondansetron (ZOFRAN-ODT) 4 MG disintegrating tablet  Every 8 hours PRN        07/30/23 2343              Marita Kansas, PA-C 07/30/23 2345    Glyn Ade, MD 07/31/23 1450

## 2023-07-31 NOTE — ED Notes (Signed)
